# Patient Record
Sex: Female | Born: 1995 | Race: Black or African American | Hispanic: No | Marital: Single | Smoking: Former smoker
Health system: Southern US, Community
[De-identification: ages and names within clinical notes are randomized; demographics above are authoritative.]

---

## 2015-06-17 ENCOUNTER — Ambulatory Visit (INDEPENDENT_AMBULATORY_CARE_PROVIDER_SITE_OTHER): Payer: PRIVATE HEALTH INSURANCE | Admitting: Family Medicine

## 2015-06-17 VITALS — BP 118/68 | HR 90 | Temp 98.2°F | Resp 20 | Ht 63.63 in | Wt 186.0 lb

## 2015-06-17 DIAGNOSIS — Z3041 Encounter for surveillance of contraceptive pills: Secondary | ICD-10-CM | POA: Diagnosis not present

## 2015-06-17 DIAGNOSIS — Z124 Encounter for screening for malignant neoplasm of cervix: Secondary | ICD-10-CM

## 2015-06-17 DIAGNOSIS — F430 Acute stress reaction: Secondary | ICD-10-CM

## 2015-06-17 DIAGNOSIS — Z0000 Encounter for general adult medical examination without abnormal findings: Secondary | ICD-10-CM

## 2015-06-17 DIAGNOSIS — Z01419 Encounter for gynecological examination (general) (routine) without abnormal findings: Secondary | ICD-10-CM | POA: Diagnosis not present

## 2015-06-17 DIAGNOSIS — E669 Obesity, unspecified: Secondary | ICD-10-CM

## 2015-06-17 DIAGNOSIS — Z6832 Body mass index (BMI) 32.0-32.9, adult: Secondary | ICD-10-CM | POA: Diagnosis not present

## 2015-06-17 LAB — POCT URINALYSIS DIP (MANUAL ENTRY)
BILIRUBIN UA: NEGATIVE
BILIRUBIN UA: NEGATIVE
Glucose, UA: NEGATIVE
Leukocytes, UA: NEGATIVE
Nitrite, UA: NEGATIVE
PROTEIN UA: NEGATIVE
SPEC GRAV UA: 1.025
Urobilinogen, UA: 0.2
pH, UA: 5.5

## 2015-06-17 LAB — POCT URINE PREGNANCY: Preg Test, Ur: NEGATIVE

## 2015-06-17 MED ORDER — NORETHINDRONE ACET-ETHINYL EST 1-20 MG-MCG PO TABS: 1.0000 | ORAL_TABLET | Freq: Every day | ORAL | Status: DC

## 2015-06-17 NOTE — Patient Instructions (Signed)
Stress and Stress Management Stress is a normal reaction to life events. It is what you feel when life demands more than you are used to or more than you can handle. Some stress can be useful. For example, the stress reaction can help you catch the last bus of the day, study for a test, or meet a deadline at work. But stress that occurs too often or for too long can cause problems. It can affect your emotional health and interfere with relationships and normal daily activities. Too much stress can weaken your immune system and increase your risk for physical illness. If you already have a medical problem, stress can make it worse. CAUSES  All sorts of life events may cause stress. An event that causes stress for one person may not be stressful for another person. Major life events commonly cause stress. These may be positive or negative. Examples include losing your job, moving into a new home, getting married, having a baby, or losing a loved one. Less obvious life events may also cause stress, especially if they occur day after day or in combination. Examples include working long hours, driving in traffic, caring for children, being in debt, or being in a difficult relationship. SIGNS AND SYMPTOMS Stress may cause emotional symptoms including, the following:  Anxiety. This is feeling worried, afraid, on edge, overwhelmed, or out of control.  Anger. This is feeling irritated or impatient.  Depression. This is feeling sad, down, helpless, or guilty.  Difficulty focusing, remembering, or making decisions. Stress may cause physical symptoms, including the following:   Aches and pains. These may affect your head, neck, back, stomach, or other areas of your body.  Tight muscles or clenched jaw.  Low energy or trouble sleeping. Stress may cause unhealthy behaviors, including the following:   Eating to feel better (overeating) or skipping meals.  Sleeping too little, too much, or both.  Working  too much or putting off tasks (procrastination).  Smoking, drinking alcohol, or using drugs to feel better. DIAGNOSIS  Stress is diagnosed through an assessment by your health care provider. Your health care provider will ask questions about your symptoms and any stressful life events.Your health care provider will also ask about your medical history and may order blood tests or other tests. Certain medical conditions and medicine can cause physical symptoms similar to stress. Mental illness can cause emotional symptoms and unhealthy behaviors similar to stress. Your health care provider may refer you to a mental health professional for further evaluation.  TREATMENT  Stress management is the recommended treatment for stress.The goals of stress management are reducing stressful life events and coping with stress in healthy ways.  Techniques for reducing stressful life events include the following:  Stress identification. Self-monitor for stress and identify what causes stress for you. These skills may help you to avoid some stressful events.  Time management. Set your priorities, keep a calendar of events, and learn to say "no." These tools can help you avoid making too many commitments. Techniques for coping with stress include the following:  Rethinking the problem. Try to think realistically about stressful events rather than ignoring them or overreacting. Try to find the positives in a stressful situation rather than focusing on the negatives.  Exercise. Physical exercise can release both physical and emotional tension. The key is to find a form of exercise you enjoy and do it regularly.  Relaxation techniques. These relax the body and mind. Examples include yoga, meditation, tai chi, biofeedback, deep  breathing, progressive muscle relaxation, listening to music, being out in nature, journaling, and other hobbies. Again, the key is to find one or more that you enjoy and can do  regularly.  Healthy lifestyle. Eat a balanced diet, get plenty of sleep, and do not smoke. Avoid using alcohol or drugs to relax.  Strong support network. Spend time with family, friends, or other people you enjoy being around.Express your feelings and talk things over with someone you trust. Counseling or talktherapy with a mental health professional may be helpful if you are having difficulty managing stress on your own. Medicine is typically not recommended for the treatment of stress.Talk to your health care provider if you think you need medicine for symptoms of stress. HOME CARE INSTRUCTIONS  Keep all follow-up visits as directed by your health care provider.  Take all medicines as directed by your health care provider. SEEK MEDICAL CARE IF:  Your symptoms get worse or you start having new symptoms.  You feel overwhelmed by your problems and can no longer manage them on your own. SEEK IMMEDIATE MEDICAL CARE IF:  You feel like hurting yourself or someone else.   This information is not intended to replace advice given to you by your health care provider. Make sure you discuss any questions you have with your health care provider.   Document Released: 10/26/2000 Document Revised: 05/23/2014 Document Reviewed: 12/25/2012 Elsevier Interactive Patient Education 2016 [employer]. Keeping You Healthy  Get These Tests  Blood Pressure- Have your blood pressure checked once a year by your health care provider.  Normal blood pressure is 120/80.  Weight- Have your body mass index (BMI) calculated to screen for obesity.  BMI is measure of body fat based on height and weight.  You can also calculate your own BMI at https://www.west-esparza.com/.  Cholesterol- Have your cholesterol checked every 5 years starting at age 65 then yearly starting at age 18.  Chlamydia, HIV, and other sexually transmitted diseases- Get screened every year until age 26, then within three months of each new sexual  provider.  Pap Test - Every 1-5 years; discuss with your health care provider.  Mammogram- Every 1-2 years starting at age 7--50  Take these medicines  Calcium with Vitamin D-Your body needs 1200 mg of Calcium each day and 3517502915 IU of Vitamin D daily.  Your body can only absorb 500 mg of Calcium at a time so Calcium must be taken in 2 or 3 divided doses throughout the day.  Multivitamin with folic acid- Once daily if it is possible for you to become pregnant.  Get these Immunizations  Gardasil-Series of three doses; prevents HPV related illness such as genital warts and cervical cancer.  Menactra-Single dose; prevents meningitis.  Tetanus shot- Every 10 years.  Flu shot-Every year.  Take these steps  Do not smoke-Your healthcare provider can help you quit.  For tips on how to quit go to www.smokefree.gov or call 1-800 QUITNOW.  Be physically active- Exercise 5 days a week for at least 30 minutes.  If you are not already physically active, start slow and gradually work up to 30 minutes of moderate physical activity.  Examples of moderate activity include walking briskly, dancing, swimming, bicycling, etc.  Breast Cancer- A self breast exam every month is important for early detection of breast cancer.  For more information and instruction on self breast exams, ask your healthcare provider or [email or web address].  Eat a healthy diet- Eat a variety of healthy foods such as fruits,  vegetables, whole grains, low fat milk, low fat cheeses, yogurt, lean meats, poultry and fish, beans, nuts, tofu, etc.  For more information go to www. Thenutritionsource.org  Drink alcohol in moderation- Limit alcohol intake to one drink or less per day. Never drink and drive.  Depression- Your emotional health is as important as your physical health.  If you're feeling down or losing interest in things you normally enjoy please talk to your healthcare provider about being  screened for depression.  Dental visit- Brush and floss your teeth twice daily; visit your dentist twice a year.  Eye doctor- Get an eye exam at least every 2 years.  Helmet use- Always wear a helmet when riding a bicycle, motorcycle, rollerblading or skateboarding.  Safe sex- If you may be exposed to sexually transmitted infections, use a condom.  Seat belts- Seat belts can save your live; always wear one.  Smoke/Carbon Monoxide detectors- These detectors need to be installed on the appropriate level of your home. Replace batteries at least once a year.  Skin cancer- When out in the sun please cover up and use sunscreen 15 SPF or higher.  Violence- If anyone is threatening or hurting you, please tell your healthcare provider.

## 2015-06-17 NOTE — Progress Notes (Signed)
Subjective:    Patient ID: Karen Green, female    DOB: Mar 31, 1996, 20 y.o.   MRN: [id number]  06/17/2015  Annual Exam and Contraception   HPI This 20 y.o. female presents for Complete Physical Examination.  Last physical:  2016 Gynecology in [place], [place] Pap smear:  none TDAP:  UTD Gardisil series/HPV:  never Influenza: never Eye exam:  Yes; yearly; contacts Dental exam:  Last year due now.  OCP continuous use.  No menses.  Microgestrin.  Stress/anxiety:  Onset one year ago.  Frequently gets stressed and suffers with headaches, neck tension. Will frequently feel overwhelmed and go to bed. Usually happens at the end of the day when working on school work. Exercising 2-3 days per week.  No Si/HI.  No cutting.  No daily or excessive crying.  Interested in an emotional support animal.  No previous counseling or medication for stress management.  Does not have time for counseling; usually dedicates most time to school  Stress and anxiety interfering slightly with relationship of seven months primarily due to lack of time to commit to relationship; does find self getting short-tempered.    Review of Systems  Constitutional: Negative for fever, chills, diaphoresis, activity change, appetite change, fatigue and unexpected weight change.  HENT: Negative for congestion, dental problem, drooling, ear discharge, ear pain, facial swelling, hearing loss, mouth sores, nosebleeds, postnasal drip, rhinorrhea, sinus pressure, sneezing, sore throat, tinnitus, trouble swallowing and voice change.   Eyes: Negative for photophobia, pain, discharge, redness, itching and visual disturbance.  Respiratory: Negative for apnea, cough, choking, chest tightness, shortness of breath, wheezing and stridor.   Cardiovascular: Negative for chest pain, palpitations and leg swelling.  Gastrointestinal: Negative for nausea, vomiting, abdominal pain, diarrhea, constipation, blood in stool, abdominal distention, anal bleeding  and rectal pain.  Endocrine: Negative for cold intolerance, heat intolerance, polydipsia, polyphagia and polyuria.  Genitourinary: Negative for dysuria, urgency, frequency, hematuria, flank pain, decreased urine volume, vaginal bleeding, vaginal discharge, enuresis, difficulty urinating, genital sores, vaginal pain, menstrual problem, pelvic pain and dyspareunia.  Musculoskeletal: Negative for myalgias, back pain, joint swelling, arthralgias, gait problem, neck pain and neck stiffness.  Skin: Negative for color change, pallor, rash and wound.  Allergic/Immunologic: Negative for environmental allergies, food allergies and immunocompromised state.  Neurological: Negative for dizziness, tremors, seizures, syncope, facial asymmetry, speech difficulty, weakness, light-headedness, numbness and headaches.  Hematological: Negative for adenopathy. Does not bruise/bleed easily.  Psychiatric/Behavioral: Negative for suicidal ideas, hallucinations, behavioral problems, confusion, sleep disturbance, self-injury, dysphoric mood, decreased concentration and agitation. The patient is nervous/anxious. The patient is not hyperactive.     History reviewed. No pertinent past medical history. History reviewed. No pertinent past surgical history. No Known Allergies Current Outpatient Prescriptions  Medication Sig Dispense Refill  . norethindrone-ethinyl estradiol (MICROGESTIN,JUNEL,LOESTRIN) 1-20 MG-MCG tablet Take 1 tablet by mouth daily. Continuous cycling. 1 Package prn   No current facility-administered medications for this visit.   Social History   Social History  . Marital Status: Single    Spouse Name: N/A  . Number of Children: N/A  . Years of Education: N/A   Occupational History  . Not on file.   Social History Main Topics  . Smoking status: Never Smoker   . Smokeless tobacco: Not on file  . Alcohol Use: No  . Drug Use: No  . Sexual Activity: Not on file   Other Topics Concern  . 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  Condoms 100%   Family History  Problem Relation Age of Onset  . Diabetes Maternal Grandfather   . Hypertension Father        Objective:    BP 118/68 mmHg  Pulse 90  Temp(Src) 98.2 F (36.8 C) (Oral)  Resp 20  Ht 5' 3.63" (1.616 m)  Wt 186 lb (84.369 kg)  BMI 32.31 kg/m2  SpO2 96%  LMP 05/19/2015 Physical Exam  Constitutional: She is oriented to person, place, and time. She appears well-developed and well-nourished. No distress.  HENT:  Head: Normocephalic and atraumatic.  Right Ear: External ear normal.  Left Ear: External ear normal.  Nose: Nose normal.  Mouth/Throat: Oropharynx is clear and moist.  Eyes: Conjunctivae and EOM are normal. Pupils are equal, round, and reactive to light.  Neck: Normal range of motion and full passive range of motion without pain. Neck supple. No JVD present. Carotid bruit is not present. No thyromegaly present.  Cardiovascular: Normal rate, regular rhythm and normal heart sounds.  Exam reveals no gallop and no friction rub.   No murmur heard. Pulmonary/Chest: Effort normal and breath sounds normal. She has no wheezes. She has no rales. Right breast exhibits no inverted nipple, no mass, no nipple discharge, no skin change and no tenderness. Left breast exhibits no inverted nipple, no mass, no nipple discharge, no skin change and no tenderness. Breasts are symmetrical.  Abdominal: Soft. Bowel sounds are normal. She exhibits no distension and no mass. There is no tenderness. There is no rebound  and no guarding.  Genitourinary: Vagina normal and uterus normal. There is no rash, tenderness or lesion on the right labia. There is no rash, tenderness or lesion on the left labia. Cervix exhibits no motion tenderness, no discharge and no friability. Right adnexum displays no mass, no tenderness and no fullness. Left adnexum displays no mass, no tenderness and no fullness.  Musculoskeletal:       Right shoulder: Normal.       Left shoulder: Normal.       Cervical back: Normal.  Lymphadenopathy:    She has no cervical adenopathy.  Neurological: She is alert and oriented to person, place, and time. She has normal reflexes. No cranial nerve deficit. She exhibits normal muscle tone. Coordination normal.  Skin: Skin is warm and dry. No rash noted. She is not diaphoretic. No erythema. No pallor.  Psychiatric: She has a normal mood and affect. Her behavior is normal. Judgment and thought content normal.  Nursing note and vitals reviewed.  No results found for this or any previous visit.     Assessment & Plan:   1. Routine physical examination   2. Encounter for routine gynecological examination   3. Encounter for surveillance of contraceptive pills   4. Cervical cancer screening   5. Stress reaction    -pap smear obtained; pt declined Gardisil series during visit. -patient declined labs screening for diabetes,  cholesterol. -rx fro Microgestin provided; doing well with good compliance. -recommend daily exercise, caffeine limitation, and counseling through university; declined letter stating that patient warrants an emotional support animal at this time due to intermittent nature of stress and lack of evidence of depression and anxiety. I do encourage patient to obtain a pet for companionship yet currently living in a dorm.  Boyfriend able to have pets and is considering a pet for patient.  Discussed s/s of depression/anxiety; recommend RTC if symptoms progress. -recommend weight loss and  exercise and low-caloric food choices. -pt declined flu vaccine.   Orders Placed This Encounter  Procedures  . POCT urinalysis dipstick  . POCT urine pregnancy   Meds ordered this encounter  Medications  . norethindrone-ethinyl estradiol (MICROGESTIN,JUNEL,LOESTRIN) 1-20 MG-MCG tablet    Sig: Take 1 tablet by mouth daily. Continuous cycling.    Dispense:  1 Package    Refill:  prn    No Follow-up on file.    Kristi Paulita Fujita, M.D. [facility] [address] [place], [place]  [address] [phone] phone [phone] fax

## 2015-06-19 LAB — PAP IG AND CT-NG NAA
Chlamydia Probe Amp: NOT DETECTED
GC Probe Amp: NOT DETECTED

## 2016-02-17 ENCOUNTER — Ambulatory Visit (INDEPENDENT_AMBULATORY_CARE_PROVIDER_SITE_OTHER): Payer: PRIVATE HEALTH INSURANCE

## 2016-02-17 ENCOUNTER — Ambulatory Visit (INDEPENDENT_AMBULATORY_CARE_PROVIDER_SITE_OTHER): Payer: PRIVATE HEALTH INSURANCE | Admitting: Physician Assistant

## 2016-02-17 VITALS — BP 130/74 | HR 101 | Temp 99.2°F | Resp 17 | Ht 63.63 in | Wt 192.0 lb

## 2016-02-17 DIAGNOSIS — M25511 Pain in right shoulder: Secondary | ICD-10-CM | POA: Diagnosis not present

## 2016-02-17 MED ORDER — MELOXICAM 15 MG PO TABS: 15.0000 mg | ORAL_TABLET | Freq: Every day | ORAL | 1 refills | Status: DC

## 2016-02-17 MED ORDER — CYCLOBENZAPRINE HCL 5 MG PO TABS: 5.0000 mg | ORAL_TABLET | Freq: Three times a day (TID) | ORAL | 0 refills | Status: DC | PRN

## 2016-02-17 NOTE — Patient Instructions (Addendum)
Apply ice or heat to affected area at least 4-5 times a day for at least 30 minutes at a time.  Use meloxicam daily and you can take flexeril up to three times a day, but be aware that it will make you drowsy.  Rest for one week and avoid heavy lifting.  Follow up in one week.   Muscle Strain A muscle strain (pulled muscle) happens when a muscle is stretched beyond normal length. It happens when a sudden, violent force stretches your muscle too far. Usually, a few of the fibers in your muscle are torn. Muscle strain is common in athletes. Recovery usually takes 1-2 weeks. Complete healing takes 5-6 weeks.  HOME CARE   Follow the PRICE method of treatment to help your injury get better. Do this the first 2-3 days after the injury:  Protect. Protect the muscle to keep it from getting injured again.  Rest. Limit your activity and rest the injured body part.  Ice. Put ice in a plastic bag. Place a towel between your skin and the bag. Then, apply the ice and leave it on from 15-20 minutes each hour. After the third day, switch to moist heat packs.  Compression. Use a splint or elastic bandage on the injured area for comfort. Do not put it on too tightly.  Elevate. Keep the injured body part above the level of your heart.  Only take medicine as told by your doctor.  Warm up before doing exercise to prevent future muscle strains. GET HELP IF:   You have more pain or puffiness (swelling) in the injured area.  You feel numbness, tingling, or notice a loss of strength in the injured area. MAKE SURE YOU:   Understand these instructions.  Will watch your condition.  Will get help right away if you are not doing well or get worse.   This information is not intended to replace advice given to you by your health care provider. Make sure you discuss any questions you have with your health care provider.   Document Released: 02/09/2008 Document Revised: 02/20/2013 Document Reviewed:  11/29/2012 Elsevier Interactive Patient Education 2016 [employer]     IF you received an x-ray today, you will receive an invoice from [facility]. Please contact [facility] at [phone] with questions or concerns regarding your invoice.   IF you received labwork today, you will receive an invoice from [facility]. Please contact [facility] at [phone] with questions or concerns regarding your invoice.   Our billing staff will not be able to assist you with questions regarding bills from these companies.  You will be contacted with the lab results as soon as they are available. The fastest way to get your results is to activate your My Chart account. Instructions are located on the last page of this paperwork. If you have not heard from us regarding the results in 2 weeks, please contact this office.

## 2016-02-17 NOTE — Progress Notes (Signed)
   Karen CastillaDaisha Green  MRN: [id number] DOB: 12/08/1995  Subjective:  Karen Green is a 20 y.o. female seen in office today for a chief complaint of right shoulder pain x 1 week. She woke up one night and tossed around, heard a popping noise in her right shoulder, felt a shocking sensation, and then went back to bed. Since this she is having pain when she lifts her right shoulder. Has associated weakness and decrease ROM. Denies numbness, tingling, and loss of sensation. Has tried ice, ibuprofen, tylenol, migraine medication for intermittent relief.   Last time she had pain in this arm was about 5 years ago. 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    Review of Systems  There are no active problems to display for this patient.   Current Outpatient Prescriptions on File Prior to Visit  Medication Sig Dispense Refill  . norethindrone-ethinyl estradiol (MICROGESTIN,JUNEL,LOESTRIN) 1-20 MG-MCG tablet Take 1 tablet by mouth daily. Continuous cycling. 1 Package prn   No current facility-administered medications on file prior to visit.     No Known Allergies  Objective:  BP 130/74 (BP Location: Right Arm, Patient Position: Sitting, Cuff Size: Normal)   Pulse (!) 101   Temp 99.2 F (37.3 C) (Oral)   Resp 17   Ht 5' 3.63" (1.616 m)   Wt 192 lb (87.1 kg)   SpO2 98%   BMI 33.34 kg/m   Physical Exam  Constitutional: She is oriented to person, place, and time and well-developed, well-nourished, and in no distress.  HENT:  Head: Normocephalic and atraumatic.  Eyes: Conjunctivae are normal.  Neck: Normal range of motion.  Pulmonary/Chest: Effort normal.  Musculoskeletal:       Right shoulder: She exhibits tenderness (along palpation of deltoid and [facility]) and bony tenderness ( along distal clavicle and spine of scapula). She exhibits normal range of motion, no swelling and normal strength.       Left shoulder: Normal.  Neurological: She is alert  and oriented to person, place, and time. Gait normal.  Skin: Skin is warm and dry.  Psychiatric: Affect normal.  Vitals reviewed.  Pulse Readings from Last 3 Encounters:  02/17/16 (!) 101  06/17/15 90   Dg Shoulder Right  Result Date: 02/17/2016 CLINICAL DATA:  Pain for 1 week EXAM: RIGHT SHOULDER - 2+ VIEW COMPARISON:  None. FINDINGS: Frontal, Y scapular, and axillary images were obtained. There is no fracture or dislocation. Joint spaces appear normal. No erosive change. Visualized right lung is clear. IMPRESSION: No fracture or dislocation.  No evident arthropathy. Electronically Signed   By: Bretta BangWilliam  Woodruff III M.D.   On: 02/17/2016 16:59    Assessment and Plan :   1. Acute pain of right shoulder -Will treat as muscle strain - DG Shoulder Right; Future - meloxicam (MOBIC) 15 MG tablet; Take 1 tablet (15 mg total) by mouth daily.  Dispense: 30 tablet; Refill: 1 - cyclobenzaprine (FLEXERIL) 5 MG tablet; Take 1 tablet (5 mg total) by mouth 3 (three) times daily as needed for muscle spasms.  Dispense: 60 tablet; Refill: 0 -Follow up in one week for reevaluation.   Benjiman CoreBrittany Wiseman PA-C  [facility] 02/17/2016 5:25 PM

## 2016-02-24 ENCOUNTER — Ambulatory Visit (INDEPENDENT_AMBULATORY_CARE_PROVIDER_SITE_OTHER): Payer: PRIVATE HEALTH INSURANCE | Admitting: Family Medicine

## 2016-02-24 VITALS — BP 132/82 | HR 85 | Temp 99.0°F | Resp 17 | Ht 64.5 in | Wt 188.0 lb

## 2016-02-24 DIAGNOSIS — M7521 Bicipital tendinitis, right shoulder: Secondary | ICD-10-CM

## 2016-02-24 DIAGNOSIS — M7522 Bicipital tendinitis, left shoulder: Secondary | ICD-10-CM

## 2016-02-24 NOTE — Patient Instructions (Addendum)
Your shoulder pain does not fit with one specific area, but you may have some component of biceps tendinitis. It may be too soon to see the benefit of meloxicam. We can try that for another week or 2, then recheck to determine if ultrasound, x-ray or other imaging/evaluation is needed.  Sooner if worse.  See instructions below.  Avoid heavy book bag for the next week or 2 possible, either with lessening the amount of books in the bag, or a possible rolling book bag if that is more comfortable. Also avoid heavy lifting and overhead lifting at work this week  Return to the clinic or go to the nearest emergency room if any of your symptoms worsen or new symptoms occur.   Biceps Tendon Tendinitis (Proximal) and Tenosynovitis With Rehab Tendonitis and tenosynovitis involve inflammation of the tendon and the tendon lining (sheath). The proximal biceps tendon is vulnerable to tendonitis and tenosynovitis, which causes pain and discomfort in the front of the shoulder and upper arm. The tendon lining secretes a fluid that helps lubricate the tendon, allowing for proper function without pain. When the tendon and its lining become inflamed, the tendon can no longer glide smoothly, causing pain. The proximal biceps tendon connects the biceps muscle to two bones of the shoulder. It is important for proper function of the elbow and turning the palm upward (supination) using the wrist. Proximal biceps tendon tendinitis may include a grade 1 or 2 strain of the tendon. Grade 1 strains involve a slight pull of the tendon without signs of tearing and no observed tendon lengthening. There is also no loss of strength. Grade 2 strains involve small tears in the tendon fibers. The tendon or muscle is stretched and strength is usually decreased.  SYMPTOMS   Pain, tenderness, swelling, warmth, or redness over the front of the shoulder.  Pain that gets worse with shoulder and elbow use, especially against  resistance.  Limited motion of the shoulder or elbow.  Crackling sound (crepitation) when the tendon or shoulder is moved or touched. CAUSES  The symptoms of biceps tendonitis are due to inflammation of the tendon. Inflammation may be caused by:  Strain from sudden increase in amount or intensity of activity.  Direct blow or injury to the elbow (uncommon).  Overuse or repetitive elbow bending or wrist rotation, particularly when turning the palm up, or with elbow hyperextension. RISK INCREASES WITH:  Sports that involve contact or overhead arm activity (throwing sports, gymnastics, weightlifting, bodybuilding, rock climbing).  Heavy labor.  Poor strength and flexibility.  Failure to warm up properly before activity. PREVENTION  Warm up and stretch properly before activity.  Allow time for recovery between activities.  Maintain physical fitness:  Strength, flexibility, and endurance.  Cardiovascular fitness.  Learn and use proper exercise technique. PROGNOSIS  With proper treatment, proximal biceps tendon tendonitis and tenosynovitis is usually curable within 6 weeks. Healing is usually quicker if the cause was a direct blow, not overuse.  RELATED COMPLICATIONS   Longer healing time if not properly treated or if not given enough time to heal.  Chronically inflamed tendon that causes persistent pain with activity, that may progress to constant pain and potentially rupture of the tendon.  Recurring symptoms, especially if activity is resumed too soon or with overuse, a direct blow, or use of poor exercise technique. TREATMENT Treatment first involves ice and medicine, to reduce pain and inflammation. It is helpful to modify activities that cause pain, to reduce the chances of causing  the condition to get worse. Strengthening and stretching exercises should be performed to promote proper use of the muscles of the shoulder. These exercises may be performed at home or with a  therapist. Other treatments may be given such as ultrasound or heat therapy. A corticosteroid injection may be recommended to help reduce inflammation of the tendon lining. Surgery is usually not necessary. Sometimes, if symptoms last for greater than 6 months, surgery will be advised to detach the tendon and re-insert it into the arm bone. Surgery to correct other shoulder problems that may be contributing to tendinitis may be advised before surgery for the tendinitis itself.  MEDICATION  If pain medicine is needed, nonsteroidal anti-inflammatory medicines (aspirin and ibuprofen), or other minor pain relievers (acetaminophen), are often advised.  Do not take pain medicine for 7 days before surgery.  Prescription pain relievers may be given if your caregiver thinks they are needed. Use only as directed and only as much as you need.  Corticosteroid injections may be given. These injections should only be used on the most severe cases, as one can only receive a limited number of them. HEAT AND COLD   Cold treatment (icing) should be applied for 10 to 15 minutes every 2 to 3 hours for inflammation and pain, and immediately after activity that aggravates your symptoms. Use ice packs or an ice massage.  Heat treatment may be used before performing stretching and strengthening activities prescribed by your caregiver, physical therapist, or athletic trainer. Use a heat pack or a warm water soak. SEEK MEDICAL CARE IF:   Symptoms get worse or do not improve in 2 weeks, despite treatment.  New, unexplained symptoms develop. (Drugs used in treatment may produce side effects.) EXERCISES RANGE OF MOTION (ROM) AND EXERCISES - Biceps Tendon (Proximal) and Tenosynovitis These exercises may help you when beginning to rehabilitate your injury. Your symptoms may go away with or without further involvement from your physician, physical therapist, or athletic trainer. While completing these exercises, remember:    Restoring tissue flexibility helps normal motion to return to the joints. This allows healthier, less painful movement and activity.  An effective stretch should be held for at least 30 seconds.  A stretch should never be painful. You should only feel a gentle lengthening or release in the stretched tissue. STRETCH - Flexion, Standing  Stand with good posture. With an underhand grip on your right / left hand and an overhand grip on the opposite hand, grasp a broomstick or cane so that your hands are a little more than shoulder width apart.  Keeping your right / left elbow straight and shoulder muscles relaxed, push the stick with your opposite hand to raise your right / left arm in front of your body and then overhead. Raise your arm until you feel a stretch in your right / left shoulder, but before you have increased shoulder pain.  Try to avoid shrugging your right / left shoulder as your arm rises, by keeping your shoulder blade tucked down and toward your mid-back spine. Hold for __________ seconds.  Slowly return to the starting position. Repeat __________ times. Complete this exercise __________ times per day. STRETCH - Abduction, Supine  Lie on your back. With an underhand grip on your right / left hand and an overhand grip on the opposite hand, grasp a broomstick or cane so that your hands are a little more than shoulder width apart.  Keeping your right / left elbow straight and shoulder muscles relaxed, push  the stick with your opposite hand to raise your right / left arm out to the side of your body and then overhead. Raise your arm until you feel a stretch in your right / left shoulder, but before you have increased shoulder pain.  Try to avoid shrugging your right / left shoulder as your arm rises, by keeping your shoulder blade tucked down and toward your mid-back spine. Hold for __________ seconds.  Slowly return to the starting position. Repeat __________ times. Complete  this exercise __________ times per day. ROM - Flexion, Active-Assisted  Lie on your back. You may bend your knees for comfort.  Grasp a broomstick or cane so your hands are about shoulder width apart. Your right / left hand should grip the end of the stick so that your hand is positioned "thumbs-up," as if you were about to shake hands.  Using your healthy arm to lead, raise your right / left arm overhead until you feel a gentle stretch in your shoulder. Hold for __________ seconds.  Use the stick to assist in returning your right / left arm to its starting position. Repeat __________ times. Complete this exercise __________ times per day.  STRETCH - Flexion, Standing   Stand facing a wall. Walk your right / left fingers up the wall until you feel a moderate stretch in your shoulder. As your hand gets higher, you may need to step closer to the wall or use a door frame to walk through.  Try to avoid shrugging your right / left shoulder as your arm rises, by keeping your shoulder blade tucked down and toward your mid-back spine.  Hold for __________ seconds. Use your other hand, if needed, to ease out of the stretch and return to the starting position. Repeat __________ times. Complete this exercise __________ times per day.  ROM - Internal Rotation   Using underhand grips, grasp a stick behind your back with both hands.  While standing upright with good posture, slide the stick up your back until you feel a mild stretch in the front of your shoulder.  Hold for __________ seconds. Slowly return to your starting position. Repeat __________ times. Complete this exercise __________ times per day.  STRETCH - Internal Rotation  Place your right / left hand behind your back, palm-up.  Throw a towel or belt over your opposite shoulder. Grasp the towel with your right / left hand.  While keeping an upright posture, gently pull up on the towel until you feel a stretch in the front of your right  / left shoulder.  Avoid shrugging your right / left shoulder as your arm rises, by keeping your shoulder blade tucked down and toward your mid-back spine.  Hold for __________ seconds. Release the stretch by lowering your opposite hand. Repeat __________ times. Complete this exercise __________ times per day. STRENGTHENING EXERCISES - Biceps Tendon Tendinitis (Proximal) and Tenosynovitis These exercises may help you regain your strength after your physician has discontinued your restraint in a cast or brace. They may resolve your symptoms with or without further involvement from your physician, physical therapist or athletic trainer. While completing these exercises, remember:   Muscles can gain both the endurance and the strength needed for everyday activities through controlled exercises.  Complete these exercises as instructed by your physician, physical therapist or athletic trainer. Increase the resistance and repetitions only as guided.  You may experience muscle soreness or fatigue, but the pain or discomfort you are trying to eliminate should never worsen  during these exercises. If this pain does get worse, stop and make sure you are following the directions exactly. If the pain is still present after adjustments, discontinue the exercise until you can discuss the trouble with your caregiver. STRENGTH - Elbow Flexors, Isometric  Stand or sit upright on a firm surface. Place your right / left arm so that your hand is palm-up and at the height of your waist.  Place your opposite hand on top of your forearm. Gently push down as your right / left arm resists. Push as hard as you can with both arms, without causing any pain or movement at your right / left elbow. Hold this stationary position for __________ seconds.  Gradually release the tension in both arms. Allow your muscles to relax completely before repeating. Repeat __________ times. Complete this exercise __________ times per  day. STRENGTH - Shoulder Flexion, Isometric  With good posture and facing a wall, stand or sit about 4-6 inches away.  Keeping your right / left elbow straight, gently press the top of your fist into the wall. Increase the pressure gradually until you are pressing as hard as you can, without shrugging your shoulder or increasing any shoulder discomfort.  Hold for __________ seconds.  Release the tension slowly. Relax your shoulder muscles completely before you start the next repetition. Repeat __________ times. Complete this exercise __________ times per day.  STRENGTH - Elbow Flexors, Supinated  With good posture, stand or sit on a firm chair without armrests. Allow your right / left arm to rest at your side with your palm facing forward.  Holding a __________ weight, or gripping a rubber exercise band or tubing,  bring your hand toward your shoulder.  Allow your muscles to control the resistance as your hand returns to your side. Repeat __________ times. Complete this exercise __________ times per day.  STRENGTH - Shoulder Flexion  Stand or sit with good posture. Grasp a __________ weight, or an exercise band or tubing, so that your hand is "thumbs-up," like when you shake hands.  Slowly lift your right / left arm as far as you can, without increasing any shoulder pain. At first, many people can only raise their hand to shoulder height.  Avoid shrugging your right / left shoulder as your arm rises, by keeping your shoulder blade tucked down and toward your mid-back spine.  Hold for __________ seconds. Control the descent of your hand as you slowly return to your starting position. Repeat __________ times. Complete this exercise __________ times per day.   This information is not intended to replace advice given to you by your health care provider. Make sure you discuss any questions you have with your health care provider.   Document Released: 05/02/2005 Document Revised:  05/23/2014 Document Reviewed: 08/14/2008 [employer] Interactive Patient Education 2016 [employer]    IF you received an x-ray today, you will receive an invoice from [facility] Please contact [facility] at [phone] with questions or concerns regarding your invoice.   IF you received labwork today, you will receive an invoice from [facility]. Please contact [facility] at [phone] with questions or concerns regarding your invoice.   Our billing staff will not be able to assist you with questions regarding bills from these companies.  You will be contacted with the lab results as soon as they are available. The fastest way to get your results is to activate your My Chart account. Instructions are located on the last page of this  paperwork. If you have not heard from us regarding the results in 2 weeks, please contact this office.      

## 2016-02-24 NOTE — Progress Notes (Addendum)
Subjective:  By signing my name below, I, Stann Ore, attest that this documentation has been prepared under the direction and in the presence of Meredith Staggers, MD. Electronically Signed: Stann Ore, Scribe. 02/24/2016 , 5:30 PM .  Patient was seen in Room 11 .   Patient ID: Karen Green, female    DOB: 1996/02/02, 21 y.o.   MRN: [id number] Chief Complaint  Patient presents with  . Shoulder Pain    both of unknown origin    HPI Karen Green is a 20 y.o. female Here for bilateral shoulder pain. She was last seen 7 days ago by [place], PA-C. In review of notes from that visit, she had right shoulder pain for 1 week, noted during sleep. 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 This was treated as shoulder strain with mobic 15mg  and flexeril 5mg .   Patient states her right shoulder pain has improved after taking the medications and is able to move it more. She notes the pain is located at the top of his right shoulder, and radiates towards collar bone, but not down her right arm. She's been taking mobic qd and flexeril qd.   Now, her left shoulder is having similar symptoms which started 4~5 days ago. She noticed it when she lifted her left arm and felt the sharp pain over the top of her left shoulder. She's applied liniment cream over both shoulders. She denies history of shoulder surgery or dislocation. She denies any changes. 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 She notes this pain feels different from the pain she had in high school. She denies trouble breathing, chest pain or abdominal pain.   She also mentions carrying a book bag weighing 20-40 lbs of books to her classes for school.  She mentions lifting weights over the summer but stopped when school started. She did not have symptoms at that point. 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   There are no active problems to display for this patient.  No past medical history  on file. No past surgical history on file. No Known Allergies Prior to Admission medications   Medication Sig Start Date End Date Taking? Authorizing Provider  cyclobenzaprine (FLEXERIL) 5 MG tablet Take 1 tablet (5 mg total) by mouth 3 (three) times daily as needed for muscle spasms. 02/17/16  Yes Magdalene River, PA-C  meloxicam (MOBIC) 15 MG tablet Take 1 tablet (15 mg total) by mouth daily. 02/17/16  Yes Magdalene River, PA-C  norethindrone-ethinyl estradiol (MICROGESTIN,JUNEL,LOESTRIN) 1-20 MG-MCG tablet Take 1 tablet by mouth daily. Continuous cycling. 06/17/15  Yes Ethelda Chick, MD   Social History   Social History  . Marital status: Single    Spouse name: N/A  . Number of children: N/A  . Years of education: N/A   Occupational History  . Not on file.   Social History Main Topics  . Smoking status: Never Smoker  . Smokeless tobacco: Not on file  . Alcohol use No  . Drug use: No  . Sexual activity: Not on file   Other Topics Concern  . 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  Condoms 100%   Review of Systems  Constitutional: Negative for chills, fatigue, fever and unexpected weight change.  Respiratory: Negative for cough.   Gastrointestinal: Negative for constipation, diarrhea, nausea and vomiting.  Musculoskeletal: Positive for arthralgias. Negative for back pain, gait problem, joint swelling and myalgias.  Skin: Negative for rash and wound.  Neurological: Negative for dizziness, weakness, numbness and headaches.       Objective:   Physical  Exam  Constitutional: She is oriented to person, place, and time. She appears well-developed and well-nourished. No distress.  HENT:  Head: Normocephalic and atraumatic.  Eyes: EOM are normal. Pupils are equal, round, and reactive to light.  Neck: Neck supple.  Full ROM of C-spine, does not recreate pain  Cardiovascular: Normal rate.   Pulmonary/Chest: Effort normal. No respiratory distress.  Musculoskeletal: Normal range of motion.  Full ROM of shoulder, slight tenderness over AC joint bilaterally, negative lift off, slight tenderness in bicipital tendon/groove R>L, negative empty can, full rotator cuff strength, minimal discomfort with cross-over Minimal discomfort with Hawkins on right, negative neer test on right, positive o'brien's on right, positive speed's and yergason's test on right Minimal discomfort with Hawkins on left, negative neer on left, negative o'brien's on left, positive yergason's test on left  Neurological: She is alert and oriented to person, place, and time.  Skin: Skin is warm and dry.  Psychiatric: She has a normal mood and affect. Her behavior is normal.  Nursing note and vitals reviewed.   Vitals:   02/24/16 1620  BP: 132/82  Pulse: 85  Resp: 17  Temp: 99 F (37.2 C)  TempSrc: Oral  SpO2: 99%  Weight: 188 lb (85.3 kg)  Height: 5' 4.5" (1.638 m)      Assessment & Plan:     Karen Green is a 20 y.o. female Biceps tendinitis of both shoulders  - suspected bicpes tendonitis, but other questionable intraarticular testing as well.  NKI.  After further discussion, new activity of carrying heavy bookbag.  May be anterior shoulder pain from compensation (deltoid +/- biceps tendonitis.)  -continue meloxicam  -wheeled bookbag or lessen weights in bookbag. Short term work restriction provided. Recheck in 1-2 weeks to determine next step (XR, U/S, PT or ortho). Sooner if worse.   No orders of the defined types were placed in this encounter.  Patient  Instructions    Your shoulder pain does not fit with one specific area, but you may have some component of biceps tendinitis. It may be too soon to see the benefit of meloxicam. We can try that for another week or 2, then recheck to determine if ultrasound, x-ray or other imaging/evaluation is needed.  Sooner if worse.  See instructions below.  Avoid heavy book bag for the next week or 2 possible, either with lessening the amount of books in the bag, or a possible rolling book bag if that is more comfortable. Also avoid heavy lifting and overhead lifting at work this week  Return to the clinic or go to the nearest emergency room if any of your symptoms worsen or new symptoms occur.   Biceps Tendon Tendinitis (Proximal) and Tenosynovitis With Rehab Tendonitis and tenosynovitis involve inflammation  of the tendon and the tendon lining (sheath). The proximal biceps tendon is vulnerable to tendonitis and tenosynovitis, which causes pain and discomfort in the front of the shoulder and upper arm. The tendon lining secretes a fluid that helps lubricate the tendon, allowing for proper function without pain. When the tendon and its lining become inflamed, the tendon can no longer glide smoothly, causing pain. The proximal biceps tendon connects the biceps muscle to two bones of the shoulder. It is important for proper function of the elbow and turning the palm upward (supination) using the wrist. Proximal biceps tendon tendinitis may include a grade 1 or 2 strain of the tendon. Grade 1 strains involve a slight pull of the tendon without signs of tearing and no observed tendon lengthening. There is also no loss of strength. Grade 2 strains involve small tears in the tendon fibers. The tendon or muscle is stretched and strength is usually decreased.  SYMPTOMS   Pain, tenderness, swelling, warmth, or redness over the front of the shoulder.  Pain that gets worse with shoulder and elbow use, especially against  resistance.  Limited motion of the shoulder or elbow.  Crackling sound (crepitation) when the tendon or shoulder is moved or touched. CAUSES  The symptoms of biceps tendonitis are due to inflammation of the tendon. Inflammation may be caused by:  Strain from sudden increase in amount or intensity of activity.  Direct blow or injury to the elbow (uncommon).  Overuse or repetitive elbow bending or wrist rotation, particularly when turning the palm up, or with elbow hyperextension. RISK INCREASES WITH:  Sports that involve contact or overhead arm activity (throwing sports, gymnastics, weightlifting, bodybuilding, rock climbing).  Heavy labor.  Poor strength and flexibility.  Failure to warm up properly before activity. PREVENTION  Warm up and stretch properly before activity.  Allow time for recovery between activities.  Maintain physical fitness:  Strength, flexibility, and endurance.  Cardiovascular fitness.  Learn and use proper exercise technique. PROGNOSIS  With proper treatment, proximal biceps tendon tendonitis and tenosynovitis is usually curable within 6 weeks. Healing is usually quicker if the cause was a direct blow, not overuse.  RELATED COMPLICATIONS   Longer healing time if not properly treated or if not given enough time to heal.  Chronically inflamed tendon that causes persistent pain with activity, that may progress to constant pain and potentially rupture of the tendon.  Recurring symptoms, especially if activity is resumed too soon or with overuse, a direct blow, or use of poor exercise technique. TREATMENT Treatment first involves ice and medicine, to reduce pain and inflammation. It is helpful to modify activities that cause pain, to reduce the chances of causing the condition to get worse. Strengthening and stretching exercises should be performed to promote proper use of the muscles of the shoulder. These exercises may be performed at home or with a  therapist. Other treatments may be given such as ultrasound or heat therapy. A corticosteroid injection may be recommended to help reduce inflammation of the tendon lining. Surgery is usually not necessary. Sometimes, if symptoms last for greater than 6 months, surgery will be advised to detach the tendon and re-insert it into the arm bone. Surgery to correct other shoulder problems that may be contributing to tendinitis may be advised before surgery for the tendinitis itself.  MEDICATION  If pain medicine is needed, nonsteroidal anti-inflammatory medicines (aspirin and ibuprofen), or other minor pain relievers (acetaminophen), are often advised.  Do not take pain medicine for 7  days before surgery.  Prescription pain relievers may be given if your caregiver thinks they are needed. Use only as directed and only as much as you need.  Corticosteroid injections may be given. These injections should only be used on the most severe cases, as one can only receive a limited number of them. HEAT AND COLD   Cold treatment (icing) should be applied for 10 to 15 minutes every 2 to 3 hours for inflammation and pain, and immediately after activity that aggravates your symptoms. Use ice packs or an ice massage.  Heat treatment may be used before performing stretching and strengthening activities prescribed by your caregiver, physical therapist, or athletic trainer. Use a heat pack or a warm water soak. SEEK MEDICAL CARE IF:   Symptoms get worse or do not improve in 2 weeks, despite treatment.  New, unexplained symptoms develop. (Drugs used in treatment may produce side effects.) EXERCISES RANGE OF MOTION (ROM) AND EXERCISES - Biceps Tendon (Proximal) and Tenosynovitis These exercises may help you when beginning to rehabilitate your injury. Your symptoms may go away with or without further involvement from your physician, physical therapist, or athletic trainer. While completing these exercises, remember:    Restoring tissue flexibility helps normal motion to return to the joints. This allows healthier, less painful movement and activity.  An effective stretch should be held for at least 30 seconds.  A stretch should never be painful. You should only feel a gentle lengthening or release in the stretched tissue. STRETCH - Flexion, Standing  Stand with good posture. With an underhand grip on your right / left hand and an overhand grip on the opposite hand, grasp a broomstick or cane so that your hands are a little more than shoulder width apart.  Keeping your right / left elbow straight and shoulder muscles relaxed, push the stick with your opposite hand to raise your right / left arm in front of your body and then overhead. Raise your arm until you feel a stretch in your right / left shoulder, but before you have increased shoulder pain.  Try to avoid shrugging your right / left shoulder as your arm rises, by keeping your shoulder blade tucked down and toward your mid-back spine. Hold for __________ seconds.  Slowly return to the starting position. Repeat __________ times. Complete this exercise __________ times per day. STRETCH - Abduction, Supine  Lie on your back. With an underhand grip on your right / left hand and an overhand grip on the opposite hand, grasp a broomstick or cane so that your hands are a little more than shoulder width apart.  Keeping your right / left elbow straight and shoulder muscles relaxed, push the stick with your opposite hand to raise your right / left arm out to the side of your body and then overhead. Raise your arm until you feel a stretch in your right / left shoulder, but before you have increased shoulder pain.  Try to avoid shrugging your right / left shoulder as your arm rises, by keeping your shoulder blade tucked down and toward your mid-back spine. Hold for __________ seconds.  Slowly return to the starting position. Repeat __________ times. Complete  this exercise __________ times per day. ROM - Flexion, Active-Assisted  Lie on your back. You may bend your knees for comfort.  Grasp a broomstick or cane so your hands are about shoulder width apart. Your right / left hand should grip the end of the stick so that your hand is positioned "  thumbs-up," as if you were about to shake hands.  Using your healthy arm to lead, raise your right / left arm overhead until you feel a gentle stretch in your shoulder. Hold for __________ seconds.  Use the stick to assist in returning your right / left arm to its starting position. Repeat __________ times. Complete this exercise __________ times per day.  STRETCH - Flexion, Standing   Stand facing a wall. Walk your right / left fingers up the wall until you feel a moderate stretch in your shoulder. As your hand gets higher, you may need to step closer to the wall or use a door frame to walk through.  Try to avoid shrugging your right / left shoulder as your arm rises, by keeping your shoulder blade tucked down and toward your mid-back spine.  Hold for __________ seconds. Use your other hand, if needed, to ease out of the stretch and return to the starting position. Repeat __________ times. Complete this exercise __________ times per day.  ROM - Internal Rotation   Using underhand grips, grasp a stick behind your back with both hands.  While standing upright with good posture, slide the stick up your back until you feel a mild stretch in the front of your shoulder.  Hold for __________ seconds. Slowly return to your starting position. Repeat __________ times. Complete this exercise __________ times per day.  STRETCH - Internal Rotation  Place your right / left hand behind your back, palm-up.  Throw a towel or belt over your opposite shoulder. Grasp the towel with your right / left hand.  While keeping an upright posture, gently pull up on the towel until you feel a stretch in the front of your right  / left shoulder.  Avoid shrugging your right / left shoulder as your arm rises, by keeping your shoulder blade tucked down and toward your mid-back spine.  Hold for __________ seconds. Release the stretch by lowering your opposite hand. Repeat __________ times. Complete this exercise __________ times per day. STRENGTHENING EXERCISES - Biceps Tendon Tendinitis (Proximal) and Tenosynovitis These exercises may help you regain your strength after your physician has discontinued your restraint in a cast or brace. They may resolve your symptoms with or without further involvement from your physician, physical therapist or athletic trainer. While completing these exercises, remember:   Muscles can gain both the endurance and the strength needed for everyday activities through controlled exercises.  Complete these exercises as instructed by your physician, physical therapist or athletic trainer. Increase the resistance and repetitions only as guided.  You may experience muscle soreness or fatigue, but the pain or discomfort you are trying to eliminate should never worsen during these exercises. If this pain does get worse, stop and make sure you are following the directions exactly. If the pain is still present after adjustments, discontinue the exercise until you can discuss the trouble with your caregiver. STRENGTH - Elbow Flexors, Isometric  Stand or sit upright on a firm surface. Place your right / left arm so that your hand is palm-up and at the height of your waist.  Place your opposite hand on top of your forearm. Gently push down as your right / left arm resists. Push as hard as you can with both arms, without causing any pain or movement at your right / left elbow. Hold this stationary position for __________ seconds.  Gradually release the tension in both arms. Allow your muscles to relax completely before repeating. Repeat __________ times. Complete  this exercise __________ times per  day. STRENGTH - Shoulder Flexion, Isometric  With good posture and facing a wall, stand or sit about 4-6 inches away.  Keeping your right / left elbow straight, gently press the top of your fist into the wall. Increase the pressure gradually until you are pressing as hard as you can, without shrugging your shoulder or increasing any shoulder discomfort.  Hold for __________ seconds.  Release the tension slowly. Relax your shoulder muscles completely before you start the next repetition. Repeat __________ times. Complete this exercise __________ times per day.  STRENGTH - Elbow Flexors, Supinated  With good posture, stand or sit on a firm chair without armrests. Allow your right / left arm to rest at your side with your palm facing forward.  Holding a __________ weight, or gripping a rubber exercise band or tubing,  bring your hand toward your shoulder.  Allow your muscles to control the resistance as your hand returns to your side. Repeat __________ times. Complete this exercise __________ times per day.  STRENGTH - Shoulder Flexion  Stand or sit with good posture. Grasp a __________ weight, or an exercise band or tubing, so that your hand is "thumbs-up," like when you shake hands.  Slowly lift your right / left arm as far as you can, without increasing any shoulder pain. At first, many people can only raise their hand to shoulder height.  Avoid shrugging your right / left shoulder as your arm rises, by keeping your shoulder blade tucked down and toward your mid-back spine.  Hold for __________ seconds. Control the descent of your hand as you slowly return to your starting position. Repeat __________ times. Complete this exercise __________ times per day.   This information is not intended to replace advice given to you by your health care provider. Make sure you discuss any questions you have with your health care provider.   Document Released: 05/02/2005 Document Revised:  05/23/2014 Document Reviewed: 08/14/2008 Elsevier Interactive Patient Education 2016 [employer].    IF you received an x-ray today, you will receive an invoice from [facility]. Please contact [facility] at [phone] with questions or concerns regarding your invoice.   IF you received labwork today, you will receive an invoice from [employer]. Please contact [facility] at [phone] with questions or concerns regarding your invoice.   Our billing staff will not be able to assist you with questions regarding bills from these companies.  You will be contacted with the lab results as soon as they are available. The fastest way to get your results is to activate your My Chart account. Instructions are located on the last page of this paperwork. If you have not heard from [place] regarding the results in 2 weeks, please contact this office.       I personally performed the services described in this documentation, which was scribed in my presence. The recorded information has been reviewed and considered, and addended by me as needed.   Signed,   Meredith Staggers, MD [facility].  02/25/16 5:19 PM

## 2016-08-10 ENCOUNTER — Other Ambulatory Visit: Payer: Self-pay | Admitting: Family Medicine

## 2016-09-23 ENCOUNTER — Encounter: Payer: Self-pay | Admitting: Physician Assistant

## 2016-09-23 ENCOUNTER — Ambulatory Visit (INDEPENDENT_AMBULATORY_CARE_PROVIDER_SITE_OTHER): Payer: PRIVATE HEALTH INSURANCE | Admitting: Physician Assistant

## 2016-09-23 VITALS — BP 150/77 | HR 87 | Temp 99.2°F | Resp 18 | Ht 64.5 in | Wt 195.2 lb

## 2016-09-23 DIAGNOSIS — R0981 Nasal congestion: Secondary | ICD-10-CM | POA: Diagnosis not present

## 2016-09-23 DIAGNOSIS — R05 Cough: Secondary | ICD-10-CM | POA: Diagnosis not present

## 2016-09-23 DIAGNOSIS — J069 Acute upper respiratory infection, unspecified: Secondary | ICD-10-CM

## 2016-09-23 DIAGNOSIS — J029 Acute pharyngitis, unspecified: Secondary | ICD-10-CM | POA: Diagnosis not present

## 2016-09-23 DIAGNOSIS — R059 Cough, unspecified: Secondary | ICD-10-CM

## 2016-09-23 LAB — POCT RAPID STREP A (OFFICE): RAPID STREP A SCREEN: NEGATIVE

## 2016-09-23 LAB — POC INFLUENZA A&B (BINAX/QUICKVUE)
INFLUENZA A, POC: NEGATIVE
INFLUENZA B, POC: NEGATIVE

## 2016-09-23 MED ORDER — BENZONATATE 100 MG PO CAPS: 100.0000 mg | ORAL_CAPSULE | Freq: Three times a day (TID) | ORAL | 0 refills | Status: DC | PRN

## 2016-09-23 MED ORDER — FLUTICASONE PROPIONATE 50 MCG/ACT NA SUSP: 2.0000 | Freq: Every day | NASAL | 0 refills | Status: DC

## 2016-09-23 MED ORDER — HYDROCODONE-HOMATROPINE 5-1.5 MG/5ML PO SYRP: 5.0000 mL | ORAL_SOLUTION | Freq: Three times a day (TID) | ORAL | 0 refills | Status: DC | PRN

## 2016-09-23 NOTE — Patient Instructions (Addendum)
Your rapid flu and strep test were negative.  We will treat this as a respiratory viral infection. I will send out a culture for your throat and contact you with the results. - I recommend you rest, drink plenty of fluids, eat light meals including soups.  - You may use OTC atrovent for nasal congestion for up to three days. Stop using it after 3 days as it can make your symptoms worse if you use it longer. Use nasal flonase spray as well. You may use cough syrup at night for your cough and sore throat, Tessalon pearls during the day. Be aware that cough syrup can definitely make you drowsy and sleepy so do not drive or operate any heavy machinery if it is affecting you during the day.  - You may also use Tylenol or ibuprofen over-the-counter for your sore throat.  - Please let me know if you are not seeing any improvement or get worse in 5-7 days.     Upper Respiratory Infection, Adult Most upper respiratory infections (URIs) are caused by a virus. A URI affects the nose, throat, and upper air passages. The most common type of URI is often called "the common cold." Follow these instructions at home:  Take medicines only as told by your doctor.  Gargle warm saltwater or take cough drops to comfort your throat as told by your doctor.  Use a warm mist humidifier or inhale steam from a shower to increase air moisture. This may make it easier to breathe.  Drink enough fluid to keep your pee (urine) clear or pale yellow.  Eat soups and other clear broths.  Have a healthy diet.  Rest as needed.  Go back to work when your fever is gone or your doctor says it is okay.  You may need to stay home longer to avoid giving your URI to others.  You can also wear a face mask and wash your hands often to prevent spread of the virus.  Use your inhaler more if you have asthma.  Do not use any tobacco products, including cigarettes, chewing tobacco, or electronic cigarettes. If you need help quitting,  ask your doctor. Contact a doctor if:  You are getting worse, not better.  Your symptoms are not helped by medicine.  You have chills.  You are getting more short of breath.  You have brown or red mucus.  You have yellow or brown discharge from your nose.  You have pain in your face, especially when you bend forward.  You have a fever.  You have puffy (swollen) neck glands.  You have pain while swallowing.  You have white areas in the back of your throat. Get help right away if:  You have very bad or constant:  Headache.  Ear pain.  Pain in your forehead, behind your eyes, and over your cheekbones (sinus pain).  Chest pain.  You have long-lasting (chronic) lung disease and any of the following:  Wheezing.  Long-lasting cough.  Coughing up blood.  A change in your usual mucus.  You have a stiff neck.  You have changes in your:  Vision.  Hearing.  Thinking.  Mood. This information is not intended to replace advice given to you by your health care provider. Make sure you discuss any questions you have with your health care provider. Document Released: 10/19/2007 Document Revised: 01/03/2016 Document Reviewed: 08/07/2013 Elsevier Interactive Patient Education  2017 [employer]   IF you received an x-ray today, you will receive  an Economist from [facility]. Please contact [facility] at [phone] with questions or concerns regarding your invoice.   IF you received labwork today, you will receive an invoice from [facility]. Please contact [facility] at [phone] with questions or concerns regarding your invoice.   Our billing staff will not be able to assist you with questions regarding bills from these companies.  You will be contacted with the lab results as soon as they are available. The fastest way to get your results is to activate your My Chart account. Instructions are located on the last page of this paperwork. If you have  not heard from [place] regarding the results in 2 weeks, please contact this office.

## 2016-09-23 NOTE — Progress Notes (Signed)
MRN: [id number] DOB: 01/23/1996  Subjective:   Karen Green Steffenhagen is a 21 y.o. female presenting for chief complaint of Sore Throat (x4days ); Cough; Nasal Congestion; and Laryngitis .  Reports 3-4 day history of sinus headache, sinus congestion, rhinorrhea, sore throat, productive cough (no hemoptysis) and myalgia, fatigue. Has tried aleve, zyrtec, theraflu, and OTC cold medication with no full relief. Denies fever, ear pain, wheezing, shortness of breath and chest pain, night sweats, chills, nausea, vomiting, abdominal pain and diarrhea. Has not had  sick contact with anyone. Has history of seasonal allergies, no history of asthma. Patient has not had flu shot this season. No smoking. Denies any other aggravating or relieving factors, no other questions or concerns.  Karen Green has a current medication list which includes the following prescription(s): norethindrone-ethinyl estradiol, omeprazole, cyclobenzaprine, and meloxicam. Also has No Known Allergies.  Karen Green  has no past medical history on file. Also  has no past surgical history on file.   Objective:   Vitals: BP (!) 150/77   Pulse 87   Temp 99.2 F (37.3 C) (Oral)   Resp 18   Ht 5' 4.5" (1.638 m)   Wt 195 lb 3.2 oz (88.5 kg)   SpO2 96%   BMI 32.99 kg/m   Physical Exam  Constitutional: She is oriented to person, place, and time. She appears well-developed and well-nourished. No distress.  HENT:  Head: Normocephalic and atraumatic.  Right Ear: Tympanic membrane, external ear and ear canal normal.  Left Ear: Tympanic membrane, external ear and ear canal normal.  Nose: Mucosal edema ( moderate bilaterally) present. Right sinus exhibits no maxillary sinus tenderness and no frontal sinus tenderness. Left sinus exhibits no maxillary sinus tenderness and no frontal sinus tenderness.  Mouth/Throat: Uvula is midline and mucous membranes are normal. Posterior oropharyngeal erythema present. Tonsils are 1+ on the right. Tonsils are 1+ on  the left. No tonsillar exudate.  Eyes: Conjunctivae are normal.  Neck: Normal range of motion.  Cardiovascular: Normal rate, regular rhythm, normal heart sounds and intact distal pulses.   Pulmonary/Chest: Effort normal and breath sounds normal. She has no wheezes. She has no rales.  Neurological: She is alert and oriented to person, place, and time.  Skin: Skin is warm and dry.  Psychiatric: She has a normal mood and affect.  Vitals reviewed.   BP Readings from Last 3 Encounters:  09/23/16 (!) 150/77  02/24/16 132/82  02/17/16 130/74    Results for orders placed or performed in visit on 09/23/16 (from the past 24 hour(s))  POC Influenza A&B(BINAX/QUICKVUE)     Status: None   Collection Time: 09/23/16  3:53 PM  Result Value Ref Range   Influenza A, POC Negative Negative   Influenza B, POC Negative Negative  POCT rapid strep A     Status: None   Collection Time: 09/23/16  3:53 PM  Result Value Ref Range   Rapid Strep A Screen Negative Negative    Assessment and Plan :  1. Sore throat Culture pending  - POC Influenza A&B(BINAX/QUICKVUE) - POCT rapid strep A - Culture, Group A Strep - Care order/instruction:  2. Cough - HYDROcodone-homatropine (HYCODAN) 5-1.5 MG/5ML syrup; Take 5 mLs by mouth every 8 (eight) hours as needed for cough.  Dispense: 120 mL; Refill: 0 - benzonatate (TESSALON) 100 MG capsule; Take 1-2 capsules (100-200 mg total) by mouth 3 (three) times daily as needed for cough.  Dispense: 40 capsule; Refill: 0  3. Nasal congestion - fluticasone (FLONASE) 50  MCG/ACT nasal spray; Place 2 sprays into both nostrils daily.  Dispense: 16 g; Refill: 0  4. Acute upper respiratory infection Consistent with viral etiology, will treat symptomatically. Pt encouraged to return to clinic if symptoms worsen, do not improve in 5-7 days, or as needed   Benjiman Core, PA-C  Primary Care at [facility] 09/23/2016 8:26 PM

## 2016-09-26 LAB — CULTURE, GROUP A STREP

## 2016-09-27 ENCOUNTER — Other Ambulatory Visit: Payer: Self-pay | Admitting: Physician Assistant

## 2016-09-27 MED ORDER — AMOXICILLIN 500 MG PO CAPS: 500.0000 mg | ORAL_CAPSULE | Freq: Two times a day (BID) | ORAL | 0 refills | Status: AC

## 2016-09-27 NOTE — Progress Notes (Signed)
Pt contacted via telephone and informed of positive beta hemolytic colonies on her strep test. I have e-prescribed amoxicillin to patient's preferred pharmacy.

## 2017-04-10 ENCOUNTER — Other Ambulatory Visit: Payer: Self-pay

## 2017-04-10 ENCOUNTER — Encounter: Payer: Self-pay | Admitting: Physician Assistant

## 2017-04-10 ENCOUNTER — Ambulatory Visit (INDEPENDENT_AMBULATORY_CARE_PROVIDER_SITE_OTHER): Payer: PRIVATE HEALTH INSURANCE | Admitting: Physician Assistant

## 2017-04-10 VITALS — BP 108/68 | HR 67 | Temp 98.6°F | Resp 16 | Ht 64.5 in | Wt 194.2 lb

## 2017-04-10 DIAGNOSIS — G478 Other sleep disorders: Secondary | ICD-10-CM | POA: Diagnosis not present

## 2017-04-10 MED ORDER — TRAZODONE HCL 50 MG PO TABS: 25.0000 mg | ORAL_TABLET | Freq: Every evening | ORAL | 0 refills | Status: DC | PRN

## 2017-04-10 NOTE — Progress Notes (Signed)
04/11/2017 2:04 PM   DOB: 04/16/1996 / MRN: [id number]  SUBJECTIVE:  Karen Green is a 21 y.o. female presenting for poor sleep.  Goes to bed at around 11:30 and can fall asleep and stay asleep until about 6:45 and tends to sleep until about 6:45 about 4-5 days a week. Tells me that she about 1 time a month she will not be able to sleep for three to four days straight and will feel tired. She has tried benadryl and several of its derivatives along with sleepy time tea.  She does not have the TV on during times of poor sleep, and denies having her phone up in the bed. Sleeps with the fan on to keep the room cool. Tells me that this started about 6 years ago. 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  Some snoring.  Minimal alcohol.  Does feel that her stress is related to her sleep.  GPA at 3.2 right now.  Waiting on some applications for [employer] come back now.  Is set on Animal Medicine.   She has No Known Allergies.   She  has no past medical history on file.    She  reports that she quit smoking about 10 months ago. Her smoking use included e-cigarettes. she has never used smokeless tobacco. She reports that she does not drink alcohol or use drugs. She  has no sexual activity history on file. The patient  has no past surgical history on file.  Her family history includes Diabetes in her maternal grandfather; Hypertension in her father.  Review of Systems  Constitutional: Negative for fever.  Respiratory: Negative for cough and shortness of breath.   Cardiovascular: Negative for leg swelling.  Gastrointestinal: Negative for heartburn.  Psychiatric/Behavioral: Negative for depression, hallucinations, memory loss, substance abuse and suicidal ideas. The patient is nervous/anxious and has insomnia.     The problem list and medications were reviewed and updated by myself where necessary and exist elsewhere in the encounter.   OBJECTIVE:  BP  108/68 (BP Location: Right Arm, Patient Position: Sitting, Cuff Size: Normal)   Pulse 67   Temp 98.6 F (37 C) (Oral)   Resp 16   Ht 5' 4.5" (1.638 m)   Wt 194 lb 3.2 oz (88.1 kg)   LMP 02/06/2017   SpO2 99%   BMI 32.82 kg/m   Physical Exam  Constitutional: She is oriented to person, place, and time. She is active.  Non-toxic appearance.  Cardiovascular: Normal rate, regular rhythm, S1 normal, S2 normal, normal heart sounds and intact distal pulses. Exam reveals no gallop, no friction rub and no decreased pulses.  No murmur heard. Pulmonary/Chest: Effort normal. No stridor. No tachypnea. No respiratory distress. She has no wheezes. She has no rales.  Abdominal: She exhibits no distension.  Musculoskeletal: She exhibits no edema.  Neurological: She is alert and oriented to person, place, and time.  Skin: Skin is warm and dry. She is not diaphoretic. No pallor.  Psychiatric: She has a normal mood and affect. Her behavior is normal. Judgment and thought content normal.    No results found for this or any previous visit (from the past 72 hour(s)).  No results found.  ASSESSMENT AND PLAN:  Karen Green was seen today for insomnia.  Diagnoses and all orders for this visit:  Poor sleep pattern: Unlikely sleep apnea.  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  Will  treat to improve sleep only.  If this fails will likely continue trazodone and start Lexapro to reduce anxiety.  -     traZODone (DESYREL) 50 MG tablet; Take 0.5-2 tablets (25-100 mg total) by mouth at bedtime as needed for sleep. Use the lowest effective dose.    The patient is advised to call or return to clinic if she does not see an improvement in symptoms, or to seek the care of the closest emergency department if she worsens with the above plan.   Deliah BostonMichael Clark, MHS, PA-C Primary Care at [facility] 04/11/2017 2:04 PM

## 2017-04-10 NOTE — Patient Instructions (Signed)
Lets see each other in about one month during your break to see how things are going.

## 2017-08-08 ENCOUNTER — Other Ambulatory Visit: Payer: Self-pay | Admitting: Physician Assistant

## 2017-08-08 DIAGNOSIS — G478 Other sleep disorders: Secondary | ICD-10-CM

## 2017-08-08 NOTE — Telephone Encounter (Signed)
Refill: trazadone Last OV: 04/10/17 Last Refill: 06/26/17 [facility] [address] [place]

## 2017-08-10 ENCOUNTER — Other Ambulatory Visit: Payer: Self-pay

## 2017-08-11 ENCOUNTER — Other Ambulatory Visit: Payer: Self-pay

## 2017-08-11 ENCOUNTER — Ambulatory Visit (INDEPENDENT_AMBULATORY_CARE_PROVIDER_SITE_OTHER): Payer: PRIVATE HEALTH INSURANCE | Admitting: Physician Assistant

## 2017-08-11 ENCOUNTER — Encounter: Payer: Self-pay | Admitting: Physician Assistant

## 2017-08-11 VITALS — BP 126/70 | HR 82 | Temp 98.1°F | Ht 64.0 in | Wt 190.4 lb

## 2017-08-11 DIAGNOSIS — G478 Other sleep disorders: Secondary | ICD-10-CM

## 2017-08-11 DIAGNOSIS — J069 Acute upper respiratory infection, unspecified: Secondary | ICD-10-CM | POA: Diagnosis not present

## 2017-08-11 MED ORDER — CETIRIZINE-PSEUDOEPHEDRINE ER 5-120 MG PO TB12: 1.0000 | ORAL_TABLET | Freq: Two times a day (BID) | ORAL | 0 refills | Status: AC

## 2017-08-11 MED ORDER — FLUTICASONE PROPIONATE 50 MCG/ACT NA SUSP: 2.0000 | Freq: Every day | NASAL | 6 refills | Status: AC

## 2017-08-11 MED ORDER — TRAZODONE HCL 50 MG PO TABS: 25.0000 mg | ORAL_TABLET | Freq: Every evening | ORAL | 3 refills | Status: AC | PRN

## 2017-08-11 MED ORDER — AMOXICILLIN-POT CLAVULANATE 875-125 MG PO TABS: 1.0000 | ORAL_TABLET | Freq: Two times a day (BID) | ORAL | 0 refills | Status: AC

## 2017-08-11 NOTE — Progress Notes (Signed)
08/11/2017 5:59 PM   DOB: 02/13/1996 / MRN: [id number]  SUBJECTIVE:  Karen Green is a 22 y.o. female presenting for trazodone refills.  Uses this for sleep.  This has been working well for her. That HPI from 04/10/18 is a follows:"Karen Green is a 22 y.o. female presenting for poor sleep.  Goes to bed at around 11:30 and can fall asleep and stay asleep until about 6:45 and tends to sleep until about 6:45 about 4-5 days a week. Tells me that she about 1 time a month she will not be able to sleep for three to four days straight and will feel tired. She has tried benadryl and several of its derivatives along with sleepy time tea.  She does not have the TV on during times of poor sleep, and denies having her phone up in the bed. Sleeps with the fan on to keep the room cool. Tells me that this started about 6 years ago. 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  Some snoring.  Minimal alcohol.  Does feel that her stress is related to her sleep.  GPA at 3.2 right now.  Waiting on some applications for [employer] come back now.  Is set on Animal Medicine."    Complain of mild cough, nasal congestion, sore throat.  Denies fever.  He is eating well. Taking OTC analgesics with some relief along with Allegra-D. Denies chest pain, SOB, new DOE, orthopnea, leg swelling.  She was actually seen earlier this month for suspected acute bacterial rhinosinusitis.  She was prescribed a Z-Pak.  Reports this seemed to make the symptoms go away.    She has No Known Allergies.   She  has no past medical history on file.    She  reports that she quit smoking about 14 months ago. Her smoking use included e-cigarettes. She has never used smokeless tobacco. She reports that she does not drink alcohol or use drugs. She  has no sexual activity history on file. The patient  has no past surgical history on file.  Her family history includes Diabetes in her maternal grandfather;  Hypertension in her father.  Review of Systems  Constitutional: Negative for fever.  Cardiovascular: Negative for chest pain.  Neurological: Negative for dizziness and headaches.  Psychiatric/Behavioral: The patient is not nervous/anxious and does not have insomnia.     The problem list and medications were reviewed and updated by myself where necessary and exist elsewhere in the encounter.   OBJECTIVE:  BP 126/70 (BP Location: Left Arm, Patient Position: Sitting, Cuff Size: Normal)   Pulse 82   Temp 98.1 F (36.7 C) (Oral)   Ht 5\' 4"  (1.626 m)   Wt 190 lb 6.4 oz (86.4 kg)   SpO2 97%   BMI 32.68 kg/m   Wt Readings from Last 3 Encounters:  08/11/17 190 lb 6.4 oz (86.4 kg)  04/10/17 194 lb 3.2 oz (88.1 kg)  09/23/16 195 lb 3.2 oz (88.5 kg)   Temp Readings from Last 3 Encounters:  08/11/17 98.1 F (36.7 C) (Oral)  04/10/17 98.6 F (37 C) (Oral)  09/23/16 99.2 F (37.3 C) (Oral)   BP Readings from Last 3 Encounters:  08/11/17 126/70  04/10/17 108/68  09/23/16 (!) 150/77   Pulse Readings from Last 3 Encounters:  08/11/17 82  04/10/17 67  09/23/16 87     Physical Exam  Constitutional: She is active.  Non-toxic appearance.  HENT:  Right Ear: Hearing, tympanic membrane, external ear and ear canal normal.  Left Ear: Hearing, tympanic membrane, external ear and ear canal normal.  Nose: Nose normal. Right sinus exhibits no maxillary sinus tenderness and no frontal sinus tenderness. Left sinus exhibits no maxillary sinus tenderness and no frontal sinus tenderness.  Mouth/Throat: Uvula is midline, oropharynx is clear and moist and mucous membranes are normal. Mucous membranes are not dry. No oropharyngeal exudate, posterior oropharyngeal edema or tonsillar abscesses.  Cardiovascular: Normal rate, regular rhythm, S1 normal, S2 normal, normal heart sounds and intact distal pulses. Exam reveals no gallop, no friction rub and no decreased pulses.  No murmur  heard. Pulmonary/Chest: Effort normal. No stridor. No tachypnea. No respiratory distress. She has no wheezes. She has no rales.  Abdominal: She exhibits no distension.  Musculoskeletal: She exhibits no edema.  Lymphadenopathy:       Head (right side): No submandibular and no tonsillar adenopathy present.       Head (left side): No submandibular and no tonsillar adenopathy present.    She has no cervical adenopathy.  Neurological: She is alert.  Skin: Skin is warm and dry. She is not diaphoretic. No pallor.    No results found for this or any previous visit (from the past 72 hour(s)).  No results found.  ASSESSMENT AND PLAN:  Karen Green was seen today for nasal congestion and medication refill.  Diagnoses and all orders for this visit:  Acute URI -     fluticasone (FLONASE) 50 MCG/ACT nasal spray; Place 2 sprays into both nostrils daily. -     cetirizine-pseudoephedrine (ZYRTEC-D) 5-120 MG tablet; Take 1 tablet by mouth 2 (two) times daily for 7 days. -     amoxicillin-clavulanate (AUGMENTIN) 875-125 MG tablet; Take 1 tablet by mouth 2 (two) times daily. Start in three days if you are not getting better. Start anytime you are getting worse.  Poor sleep pattern -     traZODone (DESYREL) 50 MG tablet; Take 0.5-2 tablets (25-100 mg total) by mouth at bedtime as needed for sleep. Use the lowest effective dose.    The patient is advised to call or return to clinic if she does not see an improvement in symptoms, or to seek the care of the closest emergency department if she worsens with the above plan.   Deliah BostonMichael Clark, MHS, PA-C Primary Care at [facility] 08/11/2017 5:59 PM

## 2017-08-11 NOTE — Patient Instructions (Addendum)
  Start the antibiotic per the signature.    IF you received an x-ray today, you will receive an invoice from [facility]. Please contact [facility] at [phone] with questions or concerns regarding your invoice.   IF you received labwork today, you will receive an invoice from [facility]. Please contact [facility] at [phone] with questions or concerns regarding your invoice.   Our billing staff will not be able to assist you with questions regarding bills from these companies.  You will be contacted with the lab results as soon as they are available. The fastest way to get your results is to activate your My Chart account. Instructions are located on the last page of this paperwork. If you have not heard from us regarding the results in 2 weeks, please contact this office.

## 2017-09-15 IMAGING — DX DG SHOULDER 2+V*R*
3 series · 3 of 3 positions shown · non-contrast
Comparison: None.

CLINICAL DATA: Pain for 1 week

EXAM:
RIGHT SHOULDER - 2+ VIEW

[shoulder ap]
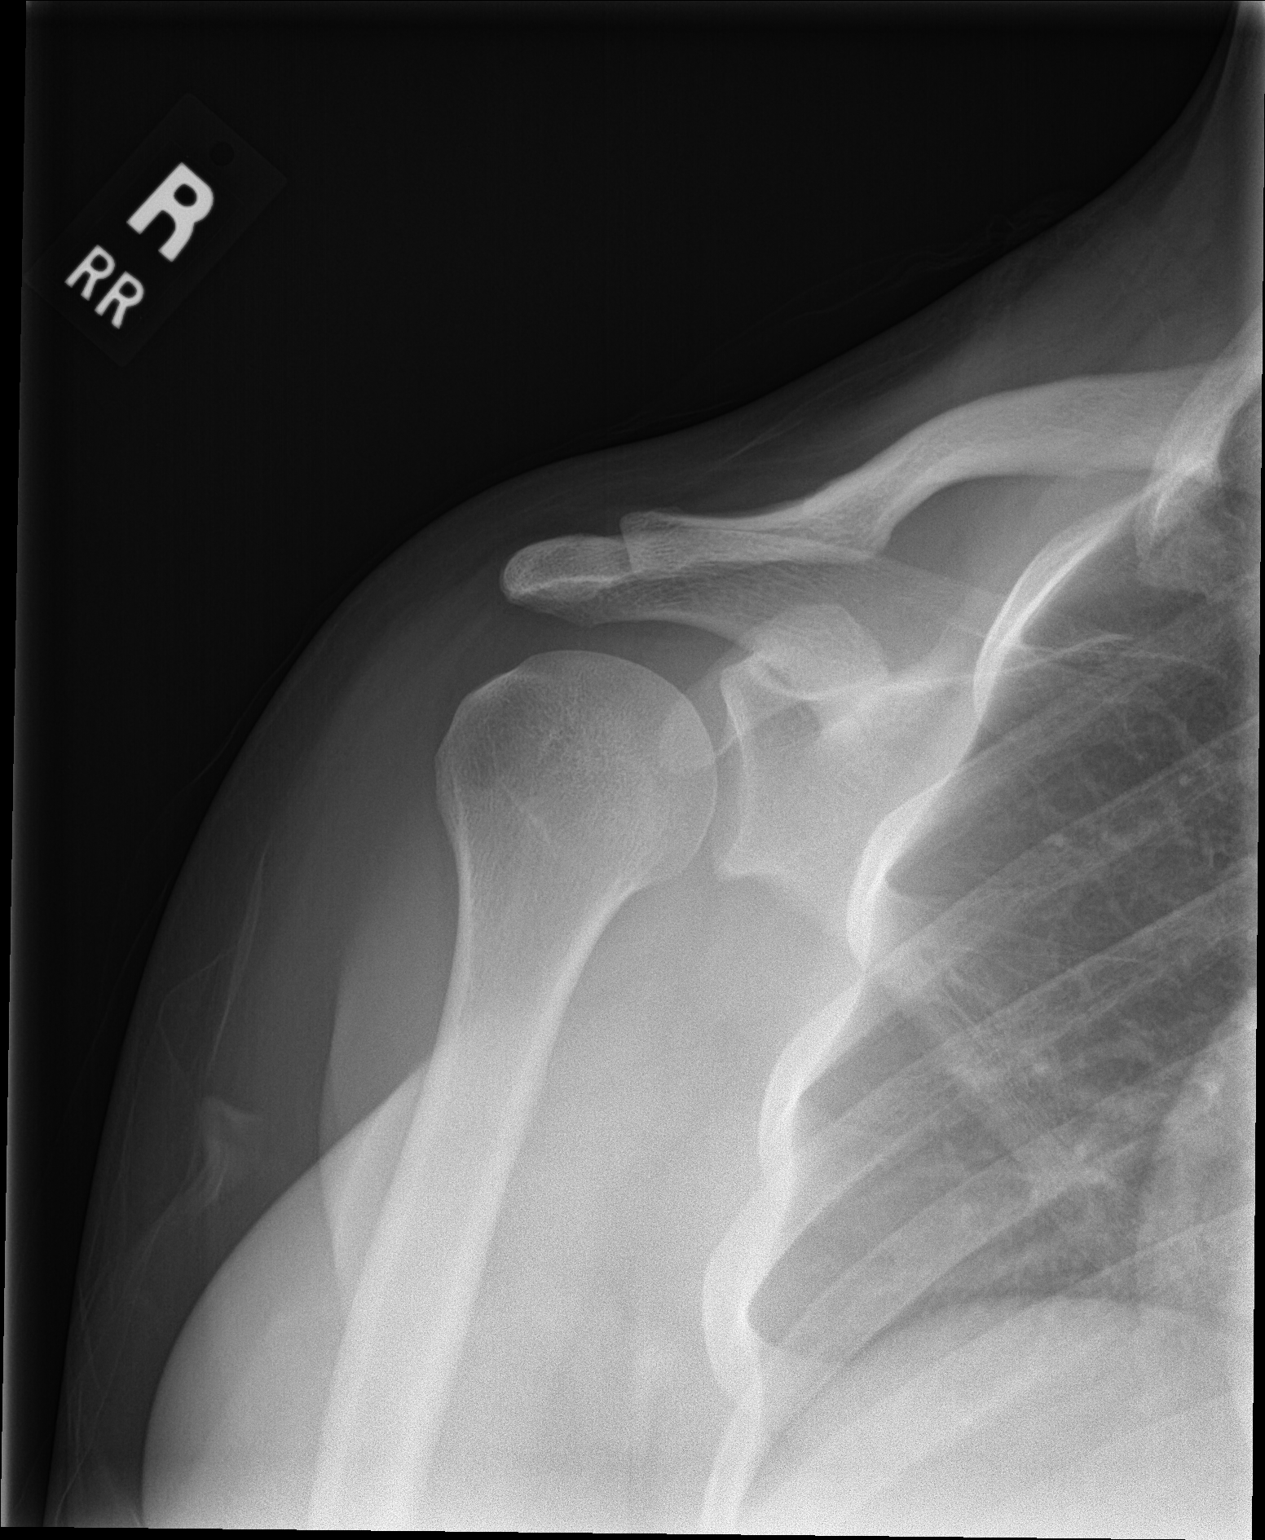

[shoulder y-view]
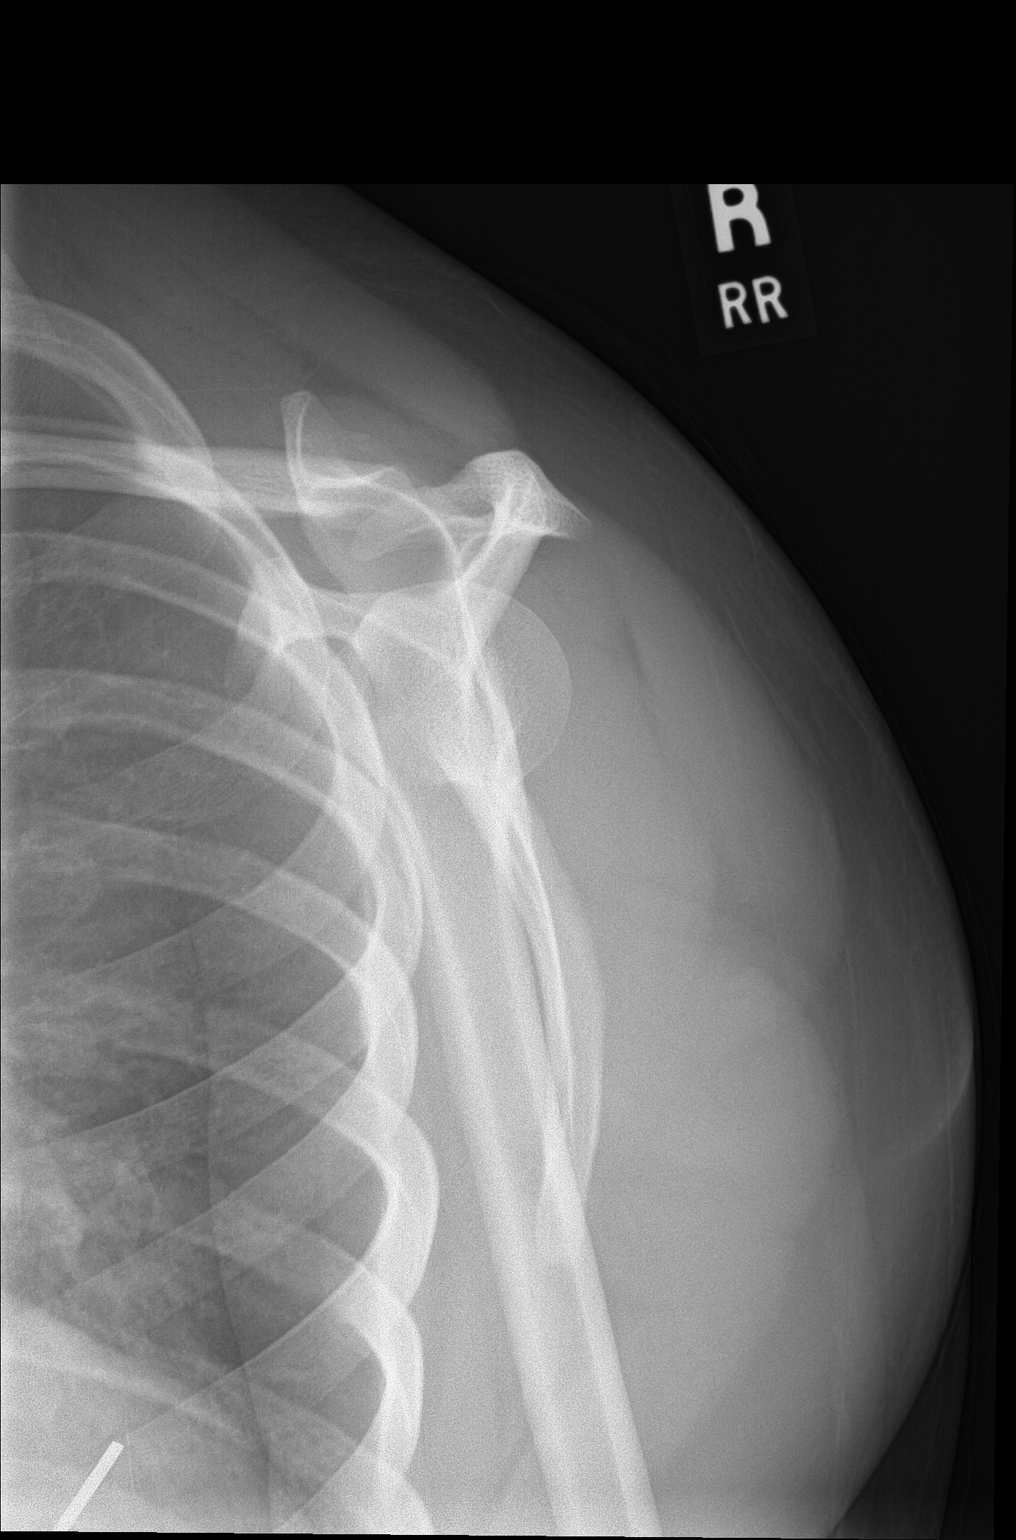

[shoulder axial]
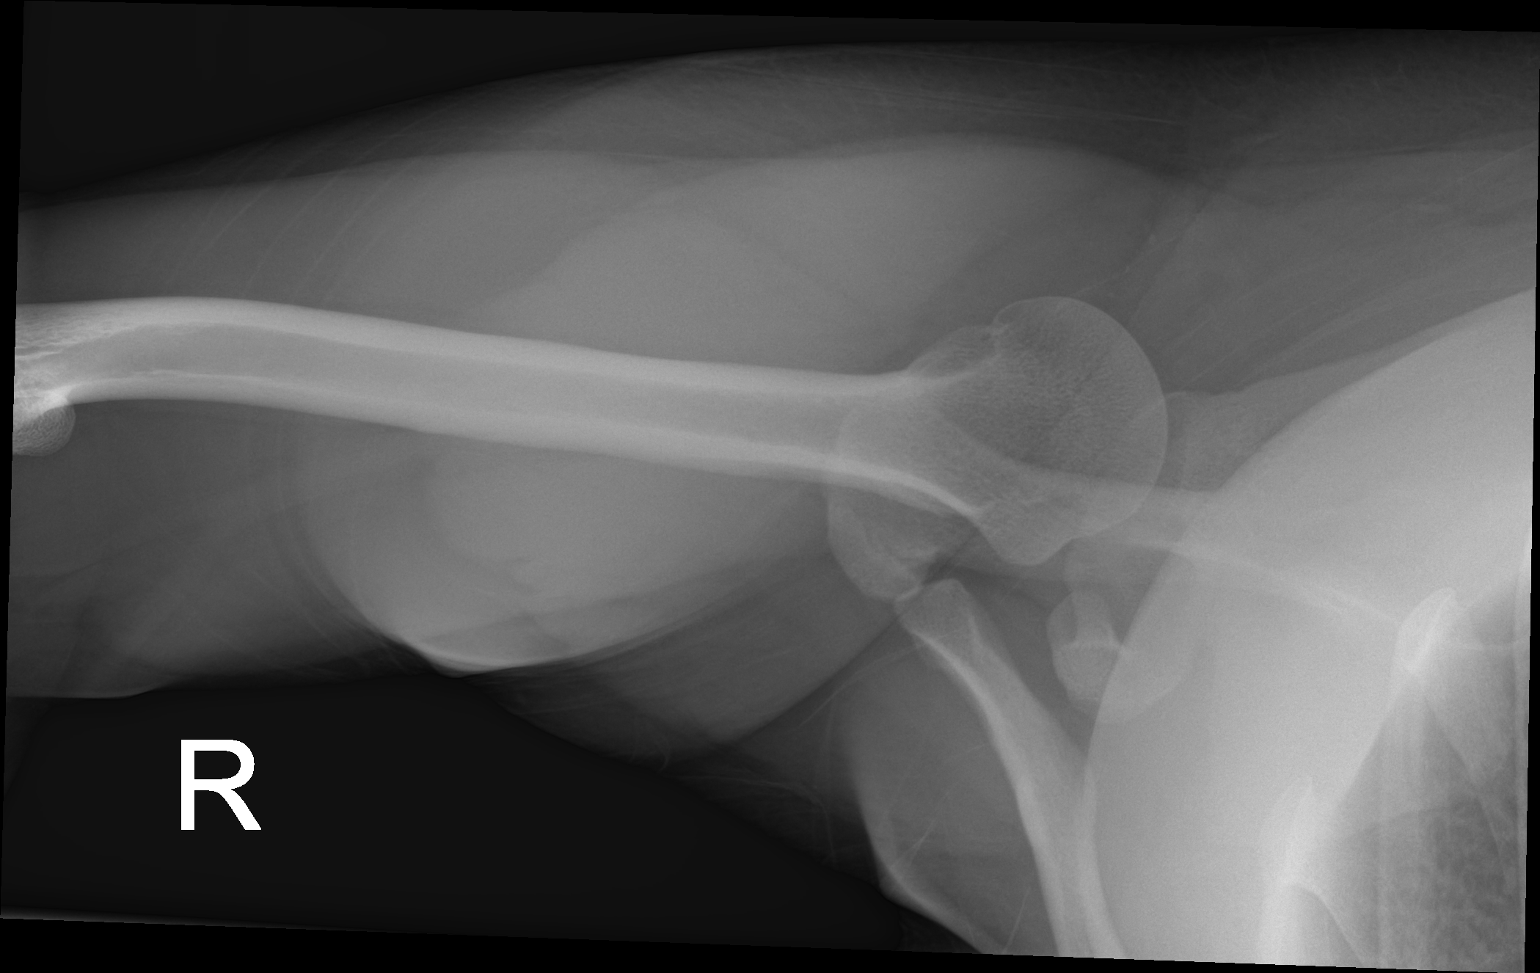

[3 of 3 positions shown; findings below may reference images not displayed]

FINDINGS: Frontal, Y scapular, and axillary images were obtained. There is no
fracture or dislocation. Joint spaces appear normal. No erosive
change. Visualized right lung is clear.
IMPRESSION: No fracture or dislocation.  No evident arthropathy.

## 2017-09-29 ENCOUNTER — Encounter: Payer: Self-pay | Admitting: Family Medicine

## 2017-10-04 ENCOUNTER — Encounter: Payer: Self-pay | Admitting: Family Medicine

## 2018-06-25 ENCOUNTER — Other Ambulatory Visit: Payer: Self-pay | Admitting: Physician Assistant

## 2018-06-25 DIAGNOSIS — G478 Other sleep disorders: Secondary | ICD-10-CM

## 2018-06-27 ENCOUNTER — Other Ambulatory Visit: Payer: Self-pay | Admitting: Physician Assistant

## 2018-06-27 DIAGNOSIS — G478 Other sleep disorders: Secondary | ICD-10-CM

## 2018-07-03 ENCOUNTER — Ambulatory Visit: Payer: PRIVATE HEALTH INSURANCE | Admitting: Family Medicine

## 2019-05-06 ENCOUNTER — Other Ambulatory Visit: Payer: Self-pay

## 2019-05-06 DIAGNOSIS — Z20822 Contact with and (suspected) exposure to covid-19: Secondary | ICD-10-CM

## 2019-05-07 ENCOUNTER — Other Ambulatory Visit: Payer: Self-pay

## 2019-05-07 LAB — NOVEL CORONAVIRUS, NAA: SARS-CoV-2, NAA: NOT DETECTED

## 2019-05-08 ENCOUNTER — Other Ambulatory Visit: Payer: Self-pay

## 2019-05-09 ENCOUNTER — Other Ambulatory Visit: Payer: Self-pay

## 2019-08-01 ENCOUNTER — Ambulatory Visit: Payer: Self-pay | Attending: Internal Medicine

## 2019-08-01 DIAGNOSIS — Z23 Encounter for immunization: Secondary | ICD-10-CM

## 2019-08-01 NOTE — Progress Notes (Signed)
   Covid-19 Vaccination Clinic  Name:  Karen Green    MRN: [id number] DOB: [birth year]  08/01/2019  [name] was observed post Covid-19 immunization for 15 minutes without incident. She was provided with Vaccine Information Sheet and instruction to access the V-Safe system.   [name] was instructed to call 911 with any severe reactions post vaccine: Marland Kitchen Difficulty breathing  . Swelling of face and throat  . A fast heartbeat  . A bad rash all over body  . Dizziness and weakness   Immunizations Administered    Name Date Dose VIS Date Route   Moderna COVID-19 Vaccine 08/01/2019  3:51 PM 0.5 mL 04/16/2019 Intramuscular   Manufacturer: Moderna   Lot: [id number]   NDC: 00164-290-37

## 2019-09-03 ENCOUNTER — Ambulatory Visit: Payer: Self-pay | Attending: Family

## 2019-09-03 DIAGNOSIS — Z23 Encounter for immunization: Secondary | ICD-10-CM

## 2019-09-03 NOTE — Progress Notes (Signed)
   Covid-19 Vaccination Clinic  Name:  Karen Green    MRN: [id number] DOB: June 19, 1995  09/03/2019  [name] was observed post Covid-19 immunization for 15 minutes without incident. She was provided with Vaccine Information Sheet and instruction to access the V-Safe system.   [name] was instructed to call 911 with any severe reactions post vaccine: Marland Kitchen Difficulty breathing  . Swelling of face and throat  . A fast heartbeat  . A bad rash all over body  . Dizziness and weakness   Immunizations Administered    Name Date Dose VIS Date Route   Moderna COVID-19 Vaccine 09/03/2019  3:52 PM 0.5 mL 04/2019 Intramuscular   Manufacturer: Moderna   Lot: [id number]   NDC: 77116-579-03

## 2020-11-30 ENCOUNTER — Emergency Department (HOSPITAL_COMMUNITY): Payer: Self-pay

## 2020-11-30 ENCOUNTER — Other Ambulatory Visit: Payer: Self-pay

## 2020-11-30 ENCOUNTER — Encounter (HOSPITAL_COMMUNITY): Payer: Self-pay

## 2020-11-30 ENCOUNTER — Emergency Department (HOSPITAL_COMMUNITY)
Admission: EM | Admit: 2020-11-30 | Discharge: 2020-11-30 | Disposition: A | Discharge status: Home / Self Care | Payer: Self-pay | Attending: Emergency Medicine | Admitting: Emergency Medicine

## 2020-11-30 DIAGNOSIS — Z87891 Personal history of nicotine dependence: Secondary | ICD-10-CM | POA: Diagnosis not present

## 2020-11-30 DIAGNOSIS — R519 Headache, unspecified: Secondary | ICD-10-CM | POA: Diagnosis not present

## 2020-11-30 DIAGNOSIS — S59911A Unspecified injury of right forearm, initial encounter: Secondary | ICD-10-CM | POA: Diagnosis present

## 2020-11-30 DIAGNOSIS — S50311A Abrasion of right elbow, initial encounter: Secondary | ICD-10-CM | POA: Diagnosis not present

## 2020-11-30 DIAGNOSIS — S50812A Abrasion of left forearm, initial encounter: Secondary | ICD-10-CM | POA: Diagnosis not present

## 2020-11-30 DIAGNOSIS — Y92410 Unspecified street and highway as the place of occurrence of the external cause: Secondary | ICD-10-CM | POA: Diagnosis not present

## 2020-11-30 DIAGNOSIS — S50811A Abrasion of right forearm, initial encounter: Secondary | ICD-10-CM | POA: Insufficient documentation

## 2020-11-30 DIAGNOSIS — S40212A Abrasion of left shoulder, initial encounter: Secondary | ICD-10-CM | POA: Insufficient documentation

## 2020-11-30 DIAGNOSIS — H9393 Unspecified disorder of ear, bilateral: Secondary | ICD-10-CM | POA: Insufficient documentation

## 2020-11-30 MED ORDER — LIDOCAINE 5 % EX PTCH: 1.0000 | MEDICATED_PATCH | CUTANEOUS | 0 refills | Status: AC

## 2020-11-30 MED ORDER — METHOCARBAMOL 500 MG PO TABS: 500.0000 mg | ORAL_TABLET | Freq: Two times a day (BID) | ORAL | 0 refills | Status: AC

## 2020-11-30 MED ORDER — METHOCARBAMOL 500 MG PO TABS: 500.0000 mg | ORAL_TABLET | Freq: Two times a day (BID) | ORAL | 0 refills | Status: DC

## 2020-11-30 MED ORDER — ACETAMINOPHEN 325 MG PO TABS: 650.0000 mg | ORAL_TABLET | Freq: Once | ORAL | Status: DC

## 2020-11-30 MED ORDER — LIDOCAINE 5 % EX PTCH: 1.0000 | MEDICATED_PATCH | CUTANEOUS | 0 refills | Status: DC
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
## 2021-02-05 ENCOUNTER — Ambulatory Visit: Payer: PRIVATE HEALTH INSURANCE | Admitting: Podiatrist

## 2021-02-13 ENCOUNTER — Emergency Department (HOSPITAL_COMMUNITY): Admission: EM | Admit: 2021-02-13 | Discharge: 2021-02-13 | Discharge status: Against Medical Advice | Payer: Self-pay

## 2021-02-13 ENCOUNTER — Other Ambulatory Visit: Payer: Self-pay

## 2021-02-19 ENCOUNTER — Ambulatory Visit: Payer: Self-pay | Admitting: Podiatry

## 2024-01-08 ENCOUNTER — Other Ambulatory Visit: Payer: Self-pay | Admitting: Medical Genetics

## 2024-01-23 ENCOUNTER — Other Ambulatory Visit: Payer: Self-pay

## 2024-03-05 ENCOUNTER — Other Ambulatory Visit: Payer: Self-pay

## 2024-03-05 DIAGNOSIS — Z006 Encounter for examination for normal comparison and control in clinical research program: Secondary | ICD-10-CM

## 2024-03-22 LAB — GENECONNECT MOLECULAR SCREEN: Genetic Analysis Overall Interpretation: NEGATIVE
